# Patient Record
Sex: Female | Born: 1995 | Race: White | Hispanic: No | Marital: Single | Smoking: Never smoker
Health system: Southern US, Community
[De-identification: ages and names within clinical notes are randomized; demographics above are authoritative.]

---

## 2017-11-09 ENCOUNTER — Encounter (HOSPITAL_COMMUNITY): Payer: Self-pay

## 2017-11-09 DIAGNOSIS — N390 Urinary tract infection, site not specified: Secondary | ICD-10-CM | POA: Insufficient documentation

## 2017-11-09 DIAGNOSIS — N200 Calculus of kidney: Secondary | ICD-10-CM | POA: Insufficient documentation

## 2017-11-09 DIAGNOSIS — R109 Unspecified abdominal pain: Secondary | ICD-10-CM | POA: Insufficient documentation

## 2017-11-09 NOTE — ED Triage Notes (Addendum)
Pt reports right flank pain for a couple of weeks, seen at [facility] on Thursday and diagnosed with "multiple small kidney stones" and placed on Norco for pain.  Pt arrives here now to see if we can treat the pain and/or "break up the stones"   Pt states she was told not to come back to [facility], but was not given a referral for urologist.

## 2017-11-10 ENCOUNTER — Emergency Department (HOSPITAL_COMMUNITY): Payer: Self-pay

## 2017-11-10 ENCOUNTER — Emergency Department (HOSPITAL_COMMUNITY)
Admission: EM | Admit: 2017-11-10 | Discharge: 2017-11-10 | Disposition: A | Discharge status: Home / Self Care | Payer: Self-pay | Attending: Emergency Medicine | Admitting: Emergency Medicine

## 2017-11-10 ENCOUNTER — Other Ambulatory Visit: Payer: Self-pay

## 2017-11-10 DIAGNOSIS — R109 Unspecified abdominal pain: Secondary | ICD-10-CM

## 2017-11-10 DIAGNOSIS — N390 Urinary tract infection, site not specified: Secondary | ICD-10-CM

## 2017-11-10 DIAGNOSIS — N200 Calculus of kidney: Secondary | ICD-10-CM

## 2017-11-10 LAB — URINALYSIS, ROUTINE W REFLEX MICROSCOPIC
BACTERIA UA: NONE SEEN
Bilirubin Urine: NEGATIVE
Glucose, UA: NEGATIVE mg/dL
Ketones, ur: NEGATIVE mg/dL
Nitrite: NEGATIVE
PH: 5 (ref 5.0–8.0)
Protein, ur: NEGATIVE mg/dL
Specific Gravity, Urine: 1.013 (ref 1.005–1.030)

## 2017-11-10 LAB — CBC WITH DIFFERENTIAL/PLATELET
Basophils Absolute: 0 10*3/uL (ref 0.0–0.1)
Basophils Relative: 0 %
EOS ABS: 0.3 10*3/uL (ref 0.0–0.7)
EOS PCT: 3 %
HCT: 40.2 % (ref 36.0–46.0)
Hemoglobin: 13.6 g/dL (ref 12.0–15.0)
LYMPHS ABS: 3 10*3/uL (ref 0.7–4.0)
Lymphocytes Relative: 31 %
MCH: 28.5 pg (ref 26.0–34.0)
MCHC: 33.8 g/dL (ref 30.0–36.0)
MCV: 84.3 fL (ref 78.0–100.0)
MONOS PCT: 7 %
Monocytes Absolute: 0.7 10*3/uL (ref 0.1–1.0)
Neutro Abs: 5.9 10*3/uL (ref 1.7–7.7)
Neutrophils Relative %: 59 %
Platelets: 214 10*3/uL (ref 150–400)
RBC: 4.77 MIL/uL (ref 3.87–5.11)
RDW: 11.7 % (ref 11.5–15.5)
WBC: 9.9 10*3/uL (ref 4.0–10.5)

## 2017-11-10 LAB — BASIC METABOLIC PANEL
Anion gap: 10 (ref 5–15)
BUN: 13 mg/dL (ref 6–20)
CALCIUM: 9.7 mg/dL (ref 8.9–10.3)
CO2: 24 mmol/L (ref 22–32)
CREATININE: 0.76 mg/dL (ref 0.44–1.00)
Chloride: 108 mmol/L (ref 101–111)
GFR calc Af Amer: >60 mL/min (ref >60)
Glucose, Bld: 95 mg/dL (ref 65–99)
Potassium: 3.8 mmol/L (ref 3.5–5.1)
SODIUM: 142 mmol/L (ref 135–145)

## 2017-11-10 LAB — PREGNANCY, URINE: Preg Test, Ur: NEGATIVE

## 2017-11-10 MED ORDER — IBUPROFEN 400 MG PO TABS
400.0000 mg | ORAL_TABLET | Freq: Once | ORAL | Status: AC
  Administered 2017-11-10: 400 mg via ORAL
  Filled 2017-11-10: qty 1

## 2017-11-10 MED ORDER — IBUPROFEN 400 MG PO TABS: 400.0000 mg | ORAL_TABLET | Freq: Four times a day (QID) | ORAL | 0 refills | Status: DC | PRN

## 2017-11-10 MED ORDER — CEPHALEXIN 500 MG PO CAPS: 500.0000 mg | ORAL_CAPSULE | Freq: Four times a day (QID) | ORAL | 0 refills | Status: DC

## 2017-11-10 NOTE — Discharge Instructions (Signed)
You have kidney stones in the kidney but none of them are working their way down to her bladder.  This is not causing her flank pain.  Take the antibiotics as prescribed for urinary tract infection and follow-up with the urologist.  Return to the ED if you develop worsening pain, fever or vomiting.

## 2017-11-10 NOTE — ED Provider Notes (Signed)
[facility] EMERGENCY DEPARTMENT Provider Note   CSN: [id number] Arrival date & time: 11/09/17  2306     History   Chief Complaint Chief Complaint  Patient presents with  . Flank Pain    HPI Megan Cherry is a 22 y.o. female.  Patient reports several weeks of right-sided flank pain that comes and goes.  This is associated with nausea.  She has some pain with urination and states she had hematuria when she was seen at [facility] last week.  She states an ultrasound was done that showed she had multiple stones in her kidney as well as swelling along her right kidney.  She returns today with worsening pain though she has not taken a Norco for several days because it makes her dizzy and nauseous.  She denies any fever or chills.  No vomiting.  No diarrhea.  No abdominal pain.  No abnormal bleeding or discharge.  She is never had a kidney stone before.  She has not followed up with urology.   The history is provided by the patient.  Flank Pain  Pertinent negatives include no chest pain, no abdominal pain, no headaches and no shortness of breath.    History reviewed. No pertinent past medical history.  There are no active problems to display for this patient.   History reviewed. No pertinent surgical history.  OB History    No data available       Home Medications    Prior to Admission medications   Medication Sig Start Date End Date Taking? Authorizing Provider  HYDROcodone-acetaminophen (NORCO/VICODIN) 5-325 MG tablet Take 1 tablet by mouth every 6 (six) hours as needed for moderate pain.   Yes [provider]    Family History No family history on file.  Social History Social History   Tobacco Use  . Smoking status: Never Smoker  . Smokeless tobacco: Never Used  Substance Use Topics  . Alcohol use: No    Frequency: Never  . Drug use: No     Allergies   Patient has no known allergies.   Review of Systems Review of Systems    Constitutional: Negative for activity change and appetite change.  HENT: Negative for congestion.   Respiratory: Negative for cough, chest tightness and shortness of breath.   Cardiovascular: Negative for chest pain.  Gastrointestinal: Positive for nausea. Negative for abdominal pain and vomiting.  Genitourinary: Positive for dysuria, flank pain and urgency. Negative for hematuria, vaginal bleeding and vaginal discharge.  Musculoskeletal: Negative for arthralgias and myalgias.  Skin: Negative for rash.  Neurological: Negative for dizziness, weakness and headaches.    all other systems are negative except as noted in the HPI and PMH.    Physical Exam Updated Vital Signs BP 114/89 (BP Location: Right Arm)   Pulse 81   Temp 98.8 F (37.1 C) (Oral)   Resp 18   Ht 5\' 4"  (1.626 m)   Wt 56.7 kg (125 lb)   SpO2 100%   BMI 21.46 kg/m   Physical Exam  Constitutional: She is oriented to person, place, and time. She appears well-developed and well-nourished. No distress.  comfortable appearing  HENT:  Head: Normocephalic and atraumatic.  Mouth/Throat: Oropharynx is clear and moist. No oropharyngeal exudate.  Eyes: Conjunctivae and EOM are normal. Pupils are equal, round, and reactive to light.  Neck: Normal range of motion. Neck supple.  No meningismus.  Cardiovascular: Normal rate, regular rhythm, normal heart sounds and intact distal pulses.  No murmur heard.  Pulmonary/Chest: Effort normal and breath sounds normal. No respiratory distress.  Abdominal: Soft. There is no tenderness. There is no rebound and no guarding.  No RUQ pain  Musculoskeletal: Normal range of motion. She exhibits tenderness. She exhibits no edema.  Right paraspinal lumbar tenderness  Neurological: She is alert and oriented to person, place, and time. No cranial nerve deficit. She exhibits normal muscle tone. Coordination normal.  No ataxia on finger to nose bilaterally. No pronator drift. 5/5 strength  throughout. CN 2-12 intact.Equal grip strength. Sensation intact.   Skin: Skin is warm.  Psychiatric: She has a normal mood and affect. Her behavior is normal.  Nursing note and vitals reviewed.    ED Treatments / Results  Labs (all labs ordered are listed, but only abnormal results are displayed) Labs Reviewed  URINALYSIS, ROUTINE W REFLEX MICROSCOPIC - Abnormal; Notable for the following components:      Result Value   Hgb urine dipstick MODERATE (*)    Leukocytes, UA SMALL (*)    Squamous Epithelial / LPF 0-5 (*)    All other components within normal limits  URINE CULTURE  PREGNANCY, URINE  CBC WITH DIFFERENTIAL/PLATELET  BASIC METABOLIC PANEL    EKG  EKG Interpretation None       Radiology Ct Renal Stone Study  Result Date: 11/10/2017 CLINICAL DATA:  Right flank pain for 2 weeks. Patient had [facility] yesterday and was told she had multiple kidney stones. Nausea. EXAM: CT ABDOMEN AND PELVIS WITHOUT CONTRAST TECHNIQUE: Multidetector CT imaging of the abdomen and pelvis was performed following the standard protocol without IV contrast. COMPARISON:  None. FINDINGS: Lower chest: Lung bases are clear. Hepatobiliary: No focal liver abnormality is seen. No gallstones, gallbladder wall thickening, or biliary dilatation. Pancreas: Unremarkable. No pancreatic ductal dilatation or surrounding inflammatory changes. Spleen: Normal in size without focal abnormality. Adrenals/Urinary Tract: No adrenal gland nodules. 3 mm stone in the lower pole left kidney. Additional tiny punctate stones in both kidneys. No hydronephrosis or hydroureter on either side. No ureteral stones or bladder stones. Bladder wall is not thickened. Stomach/Bowel: Stomach is within normal limits. Appendix appears normal. No evidence of bowel wall thickening, distention, or inflammatory changes. Vascular/Lymphatic: No significant vascular findings are present. No enlarged abdominal or pelvic lymph nodes.  Reproductive: Uterus and bilateral adnexa are unremarkable. Other: No abdominal wall hernia or abnormality. No abdominopelvic ascites. Musculoskeletal: No acute or significant osseous findings. IMPRESSION: 1. Bilateral nonobstructing intrarenal stones. Largest is in the lower pole left kidney at 3 mm. Additional punctate stones in both kidneys are too small to measure. 2. No ureteral stone or obstruction. 3. No evidence of bowel obstruction or inflammation. Electronically Signed   By: Burman NievesWilliam  Stevens M.D.   On: 11/10/2017 02:53    Procedures Procedures (including critical care time)  Medications Ordered in ED Medications  ibuprofen (ADVIL,MOTRIN) tablet 400 mg (not administered)     Initial Impression / Assessment and Plan / ED Course  I have reviewed the triage vital signs and the nursing notes.  Pertinent labs & imaging results that were available during my care of the patient were reviewed by me and considered in my medical decision making (see chart for details).    Right flank pain times several weeks diagnosed with multiple kidney stones.  No fever or vomiting.  Appears well.  Urinalysis has blood in it with white blood cells and bacteria.  We will send culture. HCG negative.  CT scan does not show any hydronephrosis or  hydroureter.  Multiple small stones in the bilateral kidneys.  Discussed with patient these are likely not causing her pain.  Will treat possible UTI. Culture sent. May have passed stone.  Follow up with urology. Return precautions discussed including worsening pain or fever. Final Clinical Impressions(s) / ED Diagnoses   Final diagnoses:  Right flank pain  Nephrolithiasis  Urinary tract infection without hematuria, site unspecified    ED Discharge Orders        Ordered    cephALEXin (KEFLEX) 500 MG capsule  4 times daily     11/10/17 0306    ibuprofen (ADVIL,MOTRIN) 400 MG tablet  Every 6 hours PRN     11/10/17 0306       Glynn Octave,  MD 11/10/17 [phone]

## 2017-11-12 LAB — URINE CULTURE: Culture: >=100000 — AB

## 2017-11-13 ENCOUNTER — Telehealth: Payer: Self-pay | Admitting: Emergency Medicine

## 2017-11-13 NOTE — Telephone Encounter (Signed)
Post ED Visit - Positive Culture Follow-up: Successful Patient Follow-Up  Culture assessed and recommendations reviewed by: []  Enzo BiNathan Batchelder, Pharm.D. []  Celedonio MiyamotoJeremy Frens, Pharm.D., BCPS AQ-ID [x]  Garvin FilaMike Maccia, Pharm.D., BCPS []  Georgina PillionElizabeth Martin, Pharm.D., BCPS []  KenwoodMinh Pham, [address]Pharm.D., BCPS, AAHIVP []  Estella HuskMichelle Turner, Pharm.D., BCPS, AAHIVP []  Lysle Pearlachel Rumbarger, PharmD, BCPS []  Casilda Carlsaylor Stone, PharmD, BCPS []  Pollyann SamplesAndy Johnston, PharmD, BCPS  Positive urine culture  []  Patient discharged without antimicrobial prescription and treatment is now indicated [x]  Organism is resistant to prescribed ED discharge antimicrobial []  Patient with positive blood cultures  Changes discussed with ED provider: Mathews RobinsonsJessica Mitchell PA New antibiotic prescription d/c keflex, start Bactrim DS 1 bid po x 7 days  Attempting to contact patient    Megan MullMiller, Megan Cherry 11/13/2017, 10:02 AM

## 2017-11-13 NOTE — Progress Notes (Signed)
ED Antimicrobial Stewardship Positive Culture Follow Up   Megan Cherry is an 22 y.o. female who presented to [facility] on 11/10/2017 with a chief complaint of  Chief Complaint  Patient presents with  . Flank Pain    Recent Results (from the past 720 hour(s))  Urine Culture     Status: Abnormal   Collection Time: 11/10/17  1:25 AM  Result Value Ref Range Status   Specimen Description   Final    URINE, CLEAN CATCH Performed at [facility], [address] [place], [place] [address]    Special Requests   Final    NONE Performed at [facility], [address] [place], [place] [address]    Culture >=100,000 COLONIES/mL STAPHYLOCOCCUS SAPROPHYTICUS (A)  Final   Report Status 11/12/2017 FINAL  Final   Organism ID, Bacteria STAPHYLOCOCCUS SAPROPHYTICUS (A)  Final      Susceptibility   Staphylococcus saprophyticus - MIC*    CIPROFLOXACIN <=0.5 SENSITIVE Sensitive     GENTAMICIN <=0.5 SENSITIVE Sensitive     NITROFURANTOIN <=16 SENSITIVE Sensitive     OXACILLIN 2 RESISTANT Resistant     TETRACYCLINE <=1 SENSITIVE Sensitive     VANCOMYCIN <=0.5 SENSITIVE Sensitive     TRIMETH/SULFA <=10 SENSITIVE Sensitive     CLINDAMYCIN RESISTANT Resistant     RIFAMPIN <=0.5 SENSITIVE Sensitive     Inducible Clindamycin POSITIVE Resistant     * >=100,000 COLONIES/mL STAPHYLOCOCCUS SAPROPHYTICUS    [x]  Treated with cephalex, organism resistant to prescribed antimicrobial []  Patient discharged originally without antimicrobial agent and treatment is now indicated  New antibiotic prescription: bactrim ds 1 tab bid x 1 week  ED Provider:Jessica Darleene CleaverMitchell, PA-C  [name] 11/13/2017, 9:20 AM Infectious Diseases Pharmacist Phone# [phone]

## 2018-08-12 ENCOUNTER — Telehealth: Payer: Self-pay | Admitting: Emergency Medicine

## 2018-08-12 NOTE — Telephone Encounter (Signed)
Lost to followup 

## 2019-01-06 IMAGING — CT CT RENAL STONE PROTOCOL
2 of 4 series · 16 of 46 positions shown, 18 images · non-contrast
Comparison: None.

CLINICAL DATA: Right flank pain for 2 weeks. Patient had ultrasound
[HOSPITAL] [facility] yesterday and was told she had multiple kidney
stones. Nausea.

EXAM:
CT ABDOMEN AND PELVIS WITHOUT CONTRAST
TECHNIQUE: Multidetector CT imaging of the abdomen and pelvis was performed
following the standard protocol without IV contrast.

[Series 2: axial st · axial · 0.61mm/px · z∈[+1096,+1501]mm · 13 of 89 slices shown, 15 images]
[im 4/89  soft-tissue]
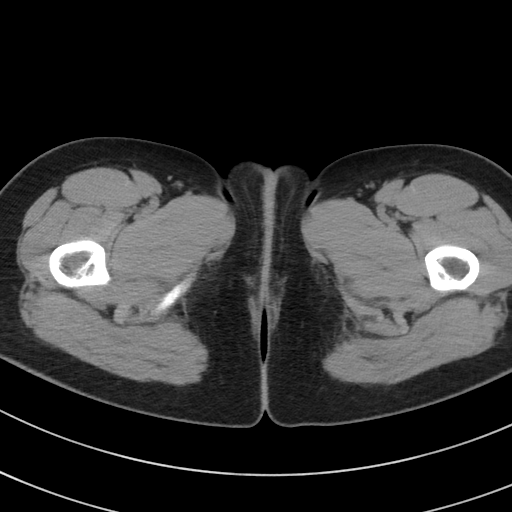
[im 4/89  bone]
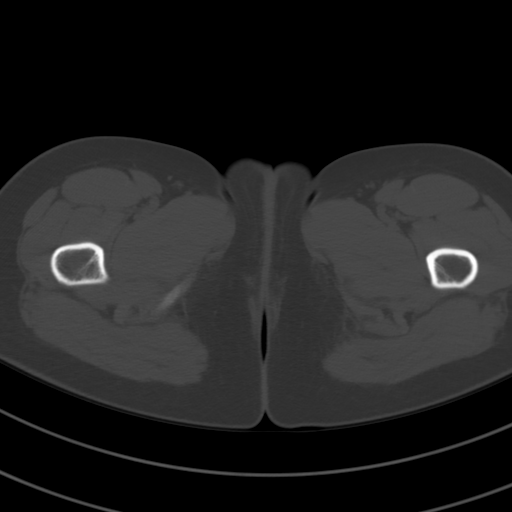
[im 11/89  soft-tissue]
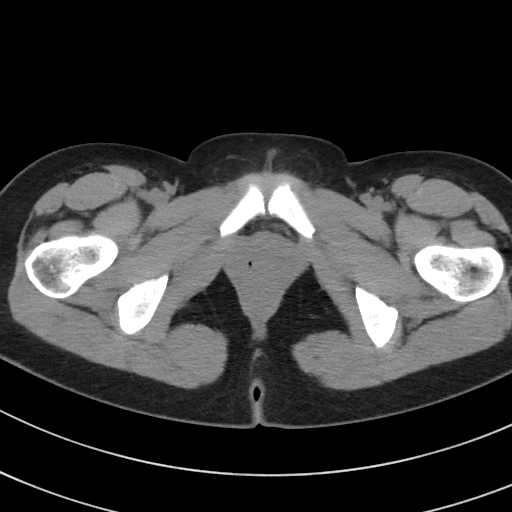
[im 18/89  soft-tissue]
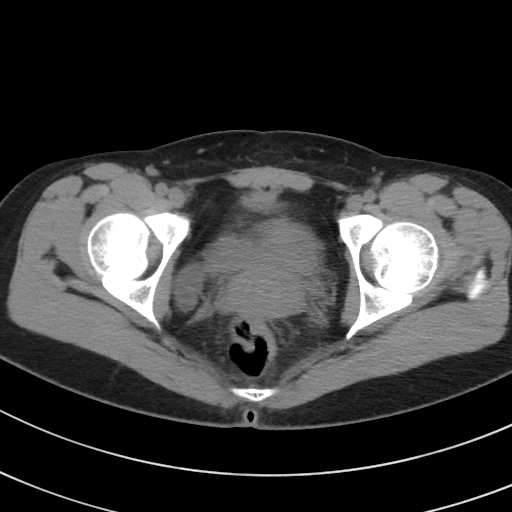
[im 25/89  soft-tissue]
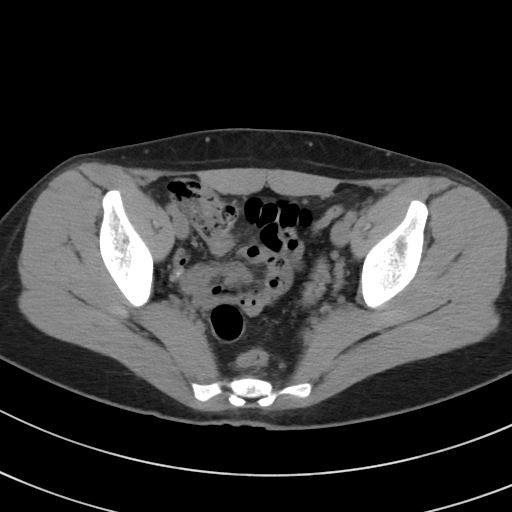
[im 32/89  soft-tissue]
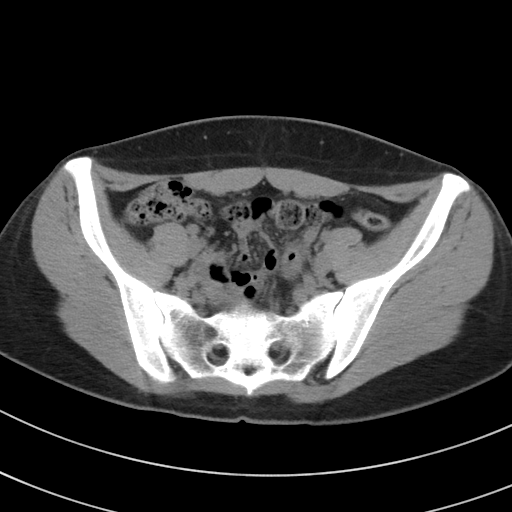
[im 39/89  soft-tissue]
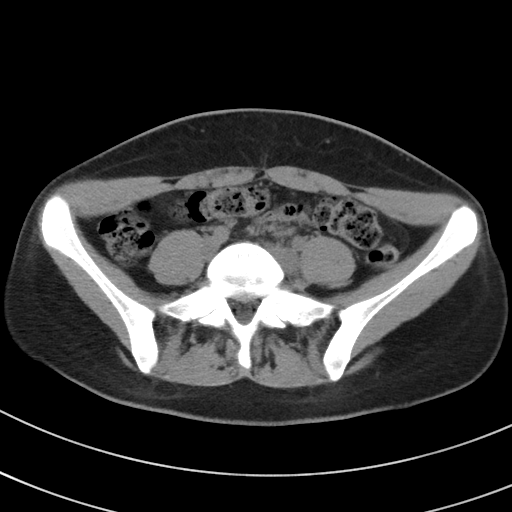
[im 46/89  soft-tissue]
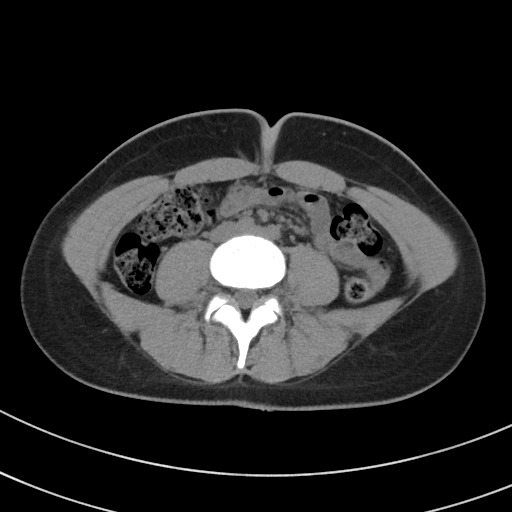
[im 50/89  soft-tissue]
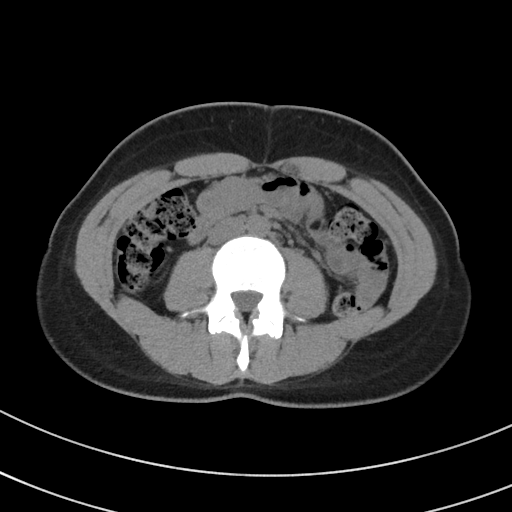
[im 57/89  soft-tissue]
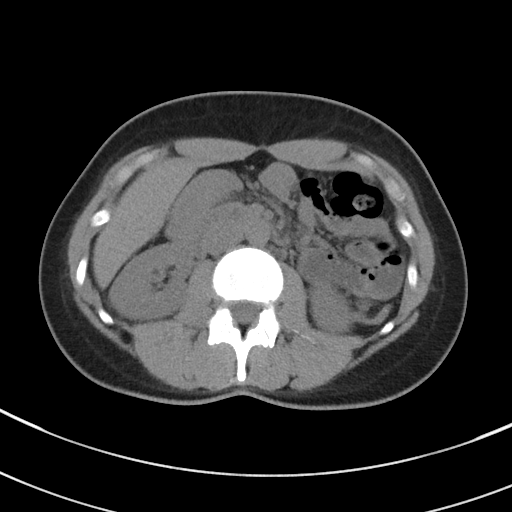
[im 57/89  bone]
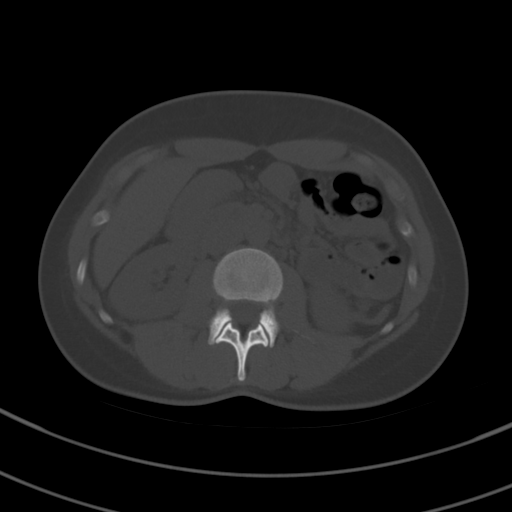
[im 64/89  soft-tissue]
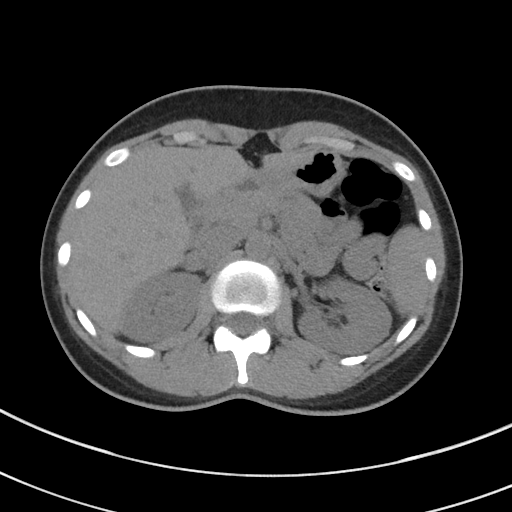
[im 71/89  soft-tissue]
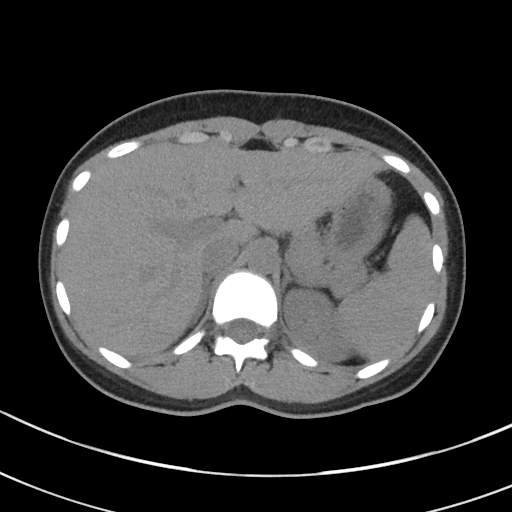
[im 78/89  soft-tissue]
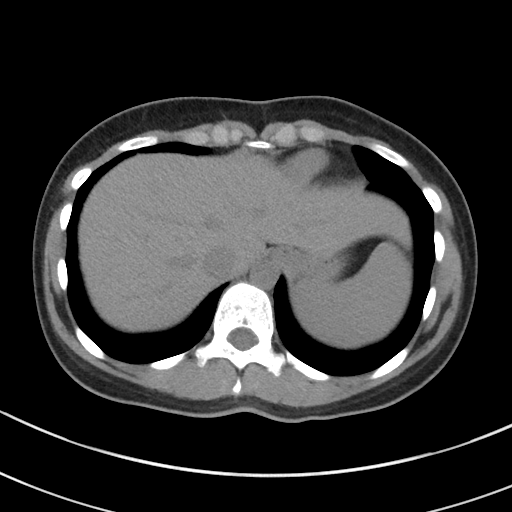
[im 85/89  soft-tissue]
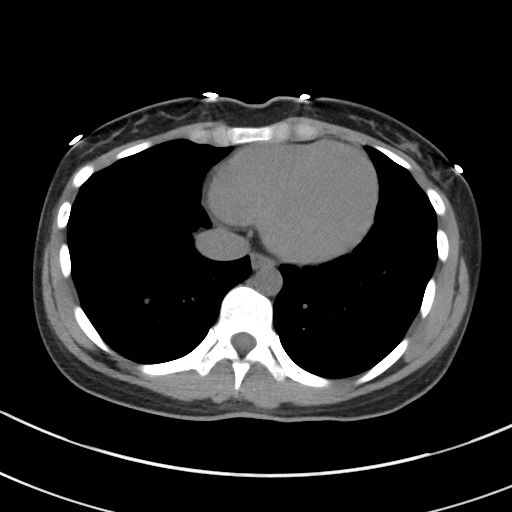

[Series 5: coronal st · coronal · 0.65mm/px · 3 of 80 slices shown]
[im 27/80  soft-tissue]
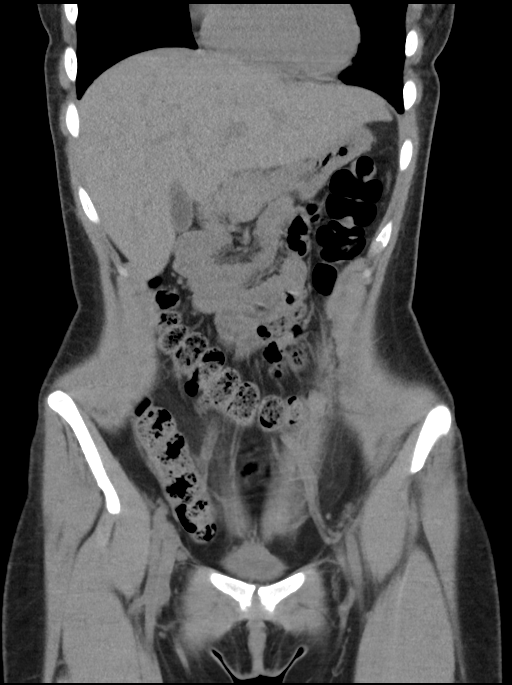
[im 36/80  soft-tissue]
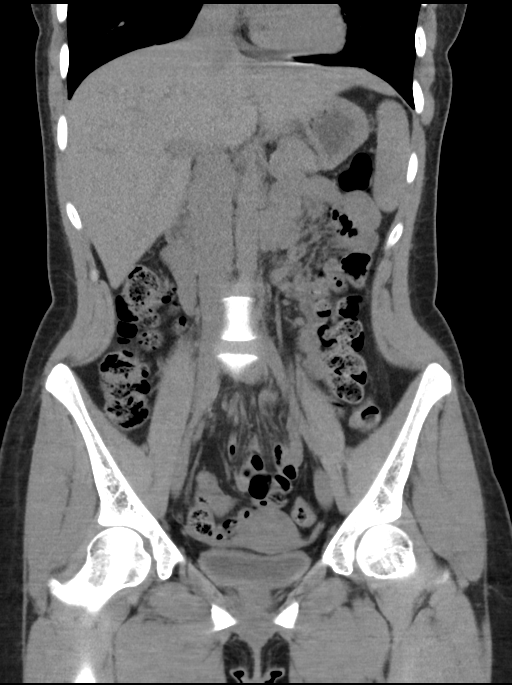
[im 44/80  soft-tissue]
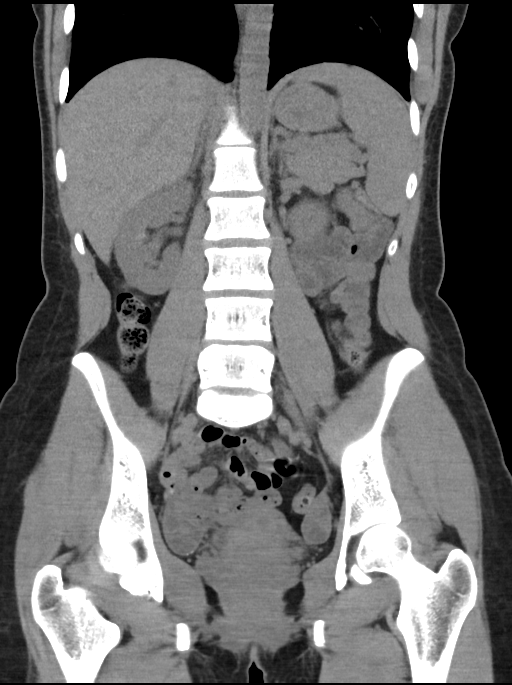

[16 of 46 positions shown; findings below may reference images not displayed]

FINDINGS: Lower chest: Lung bases are clear.

Hepatobiliary: No focal liver abnormality is seen. No gallstones,
gallbladder wall thickening, or biliary dilatation.

Pancreas: Unremarkable. No pancreatic ductal dilatation or
surrounding inflammatory changes.

Spleen: Normal in size without focal abnormality.

Adrenals/Urinary Tract: No adrenal gland nodules. 3 mm stone in the
lower pole left kidney. Additional tiny punctate stones in both
kidneys. No hydronephrosis or hydroureter on either side. No
ureteral stones or bladder stones. Bladder wall is not thickened.

Stomach/Bowel: Stomach is within normal limits. Appendix appears
normal. No evidence of bowel wall thickening, distention, or
inflammatory changes.

Vascular/Lymphatic: No significant vascular findings are present. No
enlarged abdominal or pelvic lymph nodes.

Reproductive: Uterus and bilateral adnexa are unremarkable.

Other: No abdominal wall hernia or abnormality. No abdominopelvic
ascites.

Musculoskeletal: No acute or significant osseous findings.
IMPRESSION: 1. Bilateral nonobstructing intrarenal stones. Largest is in the
lower pole left kidney at 3 mm. Additional punctate stones in both
kidneys are too small to measure.
2. No ureteral stone or obstruction.
3. No evidence of bowel obstruction or inflammation.

## 2022-10-09 HISTORY — PX: COLONOSCOPY: SHX174

## 2024-01-16 ENCOUNTER — Ambulatory Visit (INDEPENDENT_AMBULATORY_CARE_PROVIDER_SITE_OTHER)

## 2024-01-16 ENCOUNTER — Ambulatory Visit: Payer: Self-pay | Admitting: Family Medicine

## 2024-01-16 ENCOUNTER — Encounter: Payer: Self-pay | Admitting: Family Medicine

## 2024-01-16 VITALS — BP 105/65 | HR 65 | Temp 97.6°F | Ht 64.0 in | Wt 134.0 lb

## 2024-01-16 DIAGNOSIS — Z13228 Encounter for screening for other metabolic disorders: Secondary | ICD-10-CM

## 2024-01-16 DIAGNOSIS — G8929 Other chronic pain: Secondary | ICD-10-CM

## 2024-01-16 DIAGNOSIS — M25641 Stiffness of right hand, not elsewhere classified: Secondary | ICD-10-CM

## 2024-01-16 DIAGNOSIS — Z114 Encounter for screening for human immunodeficiency virus [HIV]: Secondary | ICD-10-CM | POA: Diagnosis not present

## 2024-01-16 DIAGNOSIS — M546 Pain in thoracic spine: Secondary | ICD-10-CM | POA: Diagnosis not present

## 2024-01-16 DIAGNOSIS — Z23 Encounter for immunization: Secondary | ICD-10-CM | POA: Diagnosis not present

## 2024-01-16 DIAGNOSIS — Z0001 Encounter for general adult medical examination with abnormal findings: Secondary | ICD-10-CM

## 2024-01-16 DIAGNOSIS — Z130 Encounter for screening for diseases of the blood and blood-forming organs and certain disorders involving the immune mechanism: Secondary | ICD-10-CM | POA: Diagnosis not present

## 2024-01-16 DIAGNOSIS — Z1159 Encounter for screening for other viral diseases: Secondary | ICD-10-CM

## 2024-01-16 DIAGNOSIS — M549 Dorsalgia, unspecified: Secondary | ICD-10-CM | POA: Diagnosis not present

## 2024-01-16 DIAGNOSIS — M79644 Pain in right finger(s): Secondary | ICD-10-CM | POA: Diagnosis not present

## 2024-01-16 DIAGNOSIS — Z13220 Encounter for screening for lipoid disorders: Secondary | ICD-10-CM | POA: Diagnosis not present

## 2024-01-16 DIAGNOSIS — Z1329 Encounter for screening for other suspected endocrine disorder: Secondary | ICD-10-CM | POA: Diagnosis not present

## 2024-01-16 DIAGNOSIS — Z0000 Encounter for general adult medical examination without abnormal findings: Secondary | ICD-10-CM

## 2024-01-16 LAB — LIPID PANEL

## 2024-01-16 NOTE — Progress Notes (Signed)
 Complete physical exam  Patient: Megan Cherry   DOB: 1996/07/24   28 y.o. Female  MRN: [id number]  Subjective:    Chief Complaint  Patient presents with   Hand Pain   Back Pain   Annual Exam    Megan Cherry is a 28 y.o. female who presents today for a complete physical exam. She reports consuming a  well balanced  diet.  Does piliates once a week.  She generally feels well. She reports sleeping poorly. She does have additional problems to discuss today.   Injured her right ring finger about 1 month ago. Not sure exactly what happened, but she was working with a horse and the horse bucked when the injury occurred.   Initially very swollen and bruised. Now only has pain if trying to bend finger or palpating PIP joint. Limited ROM now at PIP and isn't able to fully bend the finger. No numbness or tingling.   Also has had pain along her thoracic spine for 1 year. Pain only occurs with palpation to a particular area. Causes sharp pain that takes her breath at times. No injury. Pain doesn't radiate. Denies numbness, tingling, changes in bowel or bladder control.   Denies hx of anxiety and depression. Really feels like it's mainly a struggle with concentration and focus. Reports has been more busy lately.    Most recent fall risk assessment:    01/16/2024    8:38 AM  Fall Risk   Falls in the past year? 0     Most recent depression screenings:    01/16/2024    8:38 AM  Depression screen PHQ 2/9  Decreased Interest 2  Down, Depressed, Hopeless 0  PHQ - 2 Score 2  Altered sleeping 2  Tired, decreased energy 2  Change in appetite 1  Feeling bad or failure about yourself  0  Trouble concentrating 3  Moving slowly or fidgety/restless 2  Suicidal thoughts 0  PHQ-9 Score 12  Difficult doing work/chores Somewhat difficult      01/16/2024    8:39 AM  GAD 7 : Generalized Anxiety Score  Nervous, Anxious, on Edge 1  Control/stop worrying 1  Worry too much - different things  1  Trouble relaxing 2  Restless 0  Easily annoyed or irritable 0  Afraid - awful might happen 0  Total GAD 7 Score 5  Anxiety Difficulty Not difficult at all      Vision:Not within last year  and Dental: No current dental problems and Receives regular dental care  History reviewed. No pertinent past medical history.    Patient Care Team: Patient, No Pcp Per as PCP - General (General Practice)   Outpatient Medications Prior to Visit  Medication Sig   [DISCONTINUED] cephALEXin (KEFLEX) 500 MG capsule Take 1 capsule (500 mg total) by mouth 4 (four) times daily.   [DISCONTINUED] HYDROcodone-acetaminophen (NORCO/VICODIN) 5-325 MG tablet Take 1 tablet by mouth every 6 (six) hours as needed for moderate pain.   [DISCONTINUED] ibuprofen (ADVIL,MOTRIN) 400 MG tablet Take 1 tablet (400 mg total) by mouth every 6 (six) hours as needed.   No facility-administered medications prior to visit.    Review of Systems  Constitutional:  Negative for chills, fever, malaise/fatigue and weight loss.  Eyes:  Negative for blurred vision, double vision and photophobia.  Respiratory:  Negative for shortness of breath and wheezing.   Cardiovascular:  Negative for chest pain, palpitations and leg swelling.  Gastrointestinal:  Negative for blood in  stool.  Genitourinary:  Negative for dysuria, frequency, hematuria and urgency.  Musculoskeletal:  Positive for back pain.  Neurological:  Positive for headaches. Negative for dizziness, tingling, tremors, sensory change, speech change, focal weakness, seizures, loss of consciousness and weakness.  Psychiatric/Behavioral:  Negative for depression, Saki loss and suicidal ideas. The patient does not have insomnia.         Objective:     BP 105/65   Pulse 65   Temp 97.6 F (36.4 C) (Temporal)   Ht 5\' 4"  (1.626 m)   Wt 134 lb (60.8 kg)   LMP 01/09/2024 (Approximate)   SpO2 99%   BMI 23.00 kg/m    Physical Exam Vitals and nursing note reviewed.   Constitutional:      General: She is not in acute distress.    Appearance: Normal appearance. She is not ill-appearing, toxic-appearing or diaphoretic.  HENT:     Head: Normocephalic.     Right Ear: Tympanic membrane, ear canal and external ear normal.     Left Ear: Tympanic membrane, ear canal and external ear normal.     Nose: Nose normal.     Mouth/Throat:     Mouth: Mucous membranes are moist.     Pharynx: Oropharynx is clear.  Eyes:     Extraocular Movements: Extraocular movements intact.     Conjunctiva/sclera: Conjunctivae normal.     Pupils: Pupils are equal, round, and reactive to light.  Neck:     Thyroid: No thyroid mass, thyromegaly or thyroid tenderness.  Cardiovascular:     Rate and Rhythm: Normal rate and regular rhythm.     Pulses: Normal pulses.     Heart sounds: Normal heart sounds. No murmur heard.    No friction rub. No gallop.  Pulmonary:     Effort: Pulmonary effort is normal.     Breath sounds: Normal breath sounds.  Abdominal:     General: Bowel sounds are normal. There is no distension.     Palpations: Abdomen is soft. There is no mass.     Tenderness: There is no abdominal tenderness. There is no guarding.  Musculoskeletal:     Right hand: Bony tenderness (right ring finger PIP) present. No swelling. Decreased range of motion (right ring finger PIP- decreased flexion). Normal strength. Normal sensation. There is no disruption of two-point discrimination. Normal capillary refill.     Cervical back: Normal range of motion and neck supple. No tenderness.     Thoracic back: Bony tenderness present. No swelling, edema, deformity or signs of trauma. Normal range of motion.     Right lower leg: No edema.     Left lower leg: No edema.  Skin:    General: Skin is warm and dry.     Capillary Refill: Capillary refill takes less than 2 seconds.     Findings: No lesion or rash.  Neurological:     General: No focal deficit present.     Mental Status: She is alert  and oriented to person, place, and time.     Cranial Nerves: No cranial nerve deficit.     Motor: No weakness.     Gait: Gait normal.  Psychiatric:        Mood and Affect: Mood normal.        Behavior: Behavior normal.        Thought Content: Thought content normal.        Judgment: Judgment normal.      No results found for any visits  on 01/16/24.     Assessment & Plan:    Routine Health Maintenance and Physical Exam  [place] was seen today for hand pain, back pain and annual exam.  Diagnoses and all orders for this visit:  Routine general medical examination at a health care facility  Decreased range of motion of finger of right hand PIP joint. Xray pending. Will notify of results when available.  -     DG Finger Ring Right; Future  Chronic midline thoracic back pain Xray pending. Will notify of results when available.  -     DG Thoracic Spine 2 View; Future  Screening for endocrine, metabolic and immunity disorder Fasting labs pending.  -     CBC with Differential/Platelet -     CMP14+EGFR -     TSH  Encounter for screening for lipid disorder -     Lipid panel  Need for hepatitis C screening test -     Hepatitis C antibody  Encounter for screening for HIV -     HIV Antibody (routine testing w rflx)  Need for vaccination -     Tdap vaccine greater than or equal to 7yo IM    Immunization History  Administered Date(s) Administered   DTaP 10/27/1998, 12/16/1998, 11/10/2003, 05/27/2004   HIB, Unspecified 10/27/1998, 12/16/1998   Hepatitis B, PED/ADOLESCENT 10/27/1998, 12/16/1998, 11/10/2003   IPV 10/27/1998, 12/16/1998, 11/10/2003, 05/27/2004   MMR 11/10/2003, 05/27/2004   Tdap 03/15/2010   Varicella 11/10/2003    Health Maintenance  Topic Date Due   HIV Screening  Never done   Hepatitis C Screening  Never done   Cervical Cancer Screening (Pap smear)  Never done   DTaP/Tdap/Td (6 - Td or Tdap) 03/15/2020   HPV VACCINES (1 - 3-dose series) 01/15/2025  (Originally 08/02/2013)   COVID-19 Vaccine (1 - 2024-25 season) 01/31/2025 (Originally 06/10/2023)   INFLUENZA VACCINE  05/09/2024    Discussed health benefits of physical activity, and encouraged her to engage in regular exercise appropriate for her age and condition.  Problem List Items Addressed This Visit   None Visit Diagnoses       Routine general medical examination at a health care facility    -  Primary     Decreased range of motion of finger of right hand       Relevant Orders   DG Finger Ring Right     Chronic midline thoracic back pain       Relevant Orders   DG Thoracic Spine 2 View     Screening for endocrine, metabolic and immunity disorder       Relevant Orders   CBC with Differential/Platelet   CMP14+EGFR   TSH     Encounter for screening for lipid disorder       Relevant Orders   Lipid panel     Need for hepatitis C screening test       Relevant Orders   Hepatitis C antibody     Encounter for screening for HIV       Relevant Orders   HIV Antibody (routine testing w rflx)     Need for vaccination       Relevant Orders   Tdap vaccine greater than or equal to 7yo IM (Completed)      Return in about 4 months (around 05/17/2024) for PAP.   The patient indicates understanding of these issues and agrees with the plan.  Gabriel Earing, FNP

## 2024-01-16 NOTE — Patient Instructions (Signed)

## 2024-01-17 ENCOUNTER — Encounter: Payer: Self-pay | Admitting: Family Medicine

## 2024-01-17 LAB — CMP14+EGFR
ALT: 12 IU/L (ref 0–32)
AST: 18 IU/L (ref 0–40)
Albumin: 4.5 g/dL (ref 4.0–5.0)
Alkaline Phosphatase: 59 IU/L (ref 44–121)
BUN/Creatinine Ratio: 26 — ABNORMAL HIGH (ref 9–23)
BUN: 21 mg/dL — ABNORMAL HIGH (ref 6–20)
Bilirubin Total: 0.5 mg/dL (ref 0.0–1.2)
CO2: 23 mmol/L (ref 20–29)
Calcium: 9.5 mg/dL (ref 8.7–10.2)
Chloride: 103 mmol/L (ref 96–106)
Creatinine, Ser: 0.82 mg/dL (ref 0.57–1.00)
Globulin, Total: 2.1 g/dL (ref 1.5–4.5)
Glucose: 80 mg/dL (ref 70–99)
Potassium: 4.3 mmol/L (ref 3.5–5.2)
Sodium: 139 mmol/L (ref 134–144)
Total Protein: 6.6 g/dL (ref 6.0–8.5)
eGFR: 102 mL/min/{1.73_m2} (ref >59)

## 2024-01-17 LAB — CBC WITH DIFFERENTIAL/PLATELET
Basophils Absolute: 0.1 10*3/uL (ref 0.0–0.2)
Basos: 1 %
EOS (ABSOLUTE): 0.2 10*3/uL (ref 0.0–0.4)
Eos: 3 %
Hematocrit: 40.7 % (ref 34.0–46.6)
Hemoglobin: 13.4 g/dL (ref 11.1–15.9)
Immature Grans (Abs): 0 10*3/uL (ref 0.0–0.1)
Immature Granulocytes: 0 %
Lymphocytes Absolute: 2.2 10*3/uL (ref 0.7–3.1)
Lymphs: 28 %
MCH: 29.5 pg (ref 26.6–33.0)
MCHC: 32.9 g/dL (ref 31.5–35.7)
MCV: 90 fL (ref 79–97)
Monocytes Absolute: 0.4 10*3/uL (ref 0.1–0.9)
Monocytes: 5 %
Neutrophils Absolute: 4.9 10*3/uL (ref 1.4–7.0)
Neutrophils: 63 %
Platelets: 210 10*3/uL (ref 150–450)
RBC: 4.55 x10E6/uL (ref 3.77–5.28)
RDW: 12.8 % (ref 11.7–15.4)
WBC: 7.8 10*3/uL (ref 3.4–10.8)

## 2024-01-17 LAB — TSH: TSH: 2.72 u[IU]/mL (ref 0.450–4.500)

## 2024-01-17 LAB — LIPID PANEL
Cholesterol, Total: 157 mg/dL (ref 100–199)
HDL: 47 mg/dL (ref >39)
LDL CALC COMMENT:: 3.3 ratio (ref 0.0–4.4)
LDL Chol Calc (NIH): 96 mg/dL (ref 0–99)
Triglycerides: 73 mg/dL (ref 0–149)
VLDL Cholesterol Cal: 14 mg/dL (ref 5–40)

## 2024-01-17 LAB — HIV ANTIBODY (ROUTINE TESTING W REFLEX)

## 2024-01-17 LAB — HEPATITIS C ANTIBODY

## 2024-01-19 DIAGNOSIS — Z419 Encounter for procedure for purposes other than remedying health state, unspecified: Secondary | ICD-10-CM | POA: Diagnosis not present

## 2024-01-23 ENCOUNTER — Encounter: Payer: Self-pay | Admitting: Family Medicine

## 2024-01-23 ENCOUNTER — Other Ambulatory Visit: Payer: Self-pay | Admitting: Family Medicine

## 2024-01-23 DIAGNOSIS — M25641 Stiffness of right hand, not elsewhere classified: Secondary | ICD-10-CM

## 2024-02-12 ENCOUNTER — Ambulatory Visit: Admitting: Physician Assistant

## 2024-02-14 ENCOUNTER — Ambulatory Visit: Payer: Self-pay | Admitting: Physician Assistant

## 2024-02-18 DIAGNOSIS — Z419 Encounter for procedure for purposes other than remedying health state, unspecified: Secondary | ICD-10-CM | POA: Diagnosis not present

## 2024-02-19 ENCOUNTER — Encounter: Payer: Self-pay | Admitting: Physician Assistant

## 2024-02-19 ENCOUNTER — Ambulatory Visit (INDEPENDENT_AMBULATORY_CARE_PROVIDER_SITE_OTHER): Admitting: Physician Assistant

## 2024-02-19 DIAGNOSIS — M79644 Pain in right finger(s): Secondary | ICD-10-CM

## 2024-02-19 DIAGNOSIS — M546 Pain in thoracic spine: Secondary | ICD-10-CM | POA: Diagnosis not present

## 2024-02-19 NOTE — Progress Notes (Signed)
 Office Visit Note   Patient: Megan Cherry           Date of Birth: [birth year]           MRN: [id number] Visit Date: 02/19/2024              Requested by: Albertha Huger, FNP [address] [place],  [place] [address] PCP: Albertha Huger, FNP   Assessment & Plan: Visit Diagnoses:  1. Pain in right finger(s)   2. Pain in thoracic spine     Plan: Patient is a pleasant 28 year old woman who comes in for 2 reasons today.  First she has a 2-1/8-month history of decreased range of motion and pain in her right ring finger.  She is left-hand dominant.  She said this first started when she was holding onto a lead line when she was placing her horse in a pasture.  The course reared up.  She is not quite sure what happened but after that she had significant ecchymosis pain and swelling which she thinks started at the PIP joint of the right ring finger.  Over the last 2 months she has had some improvement but still has focal pain and loss of motion making her grip strength decreased.  She did have x-rays which did not demonstrate any bony abnormality.  Since this has been going on for couple months I suggested trying a couple sessions with occupational therapy she is willing to do this. With regards to her other issues she has been having focal thoracic spine pain for about a year.  She denies any injury.  She does say she has a family history of back issues and as she is engaged in preparing to get married she does not want this to affect her as she is married and anticipates having a family.  She said it does not give her much radicular symptoms but is focal focally tender in 1 area.  Exam today she has 1 area of focal tenderness no radicular findings been going on over a year without any type of particular injury.  I have told her we could get an MRI she could follow-up with one of our spine  Follow-Up Instructions: No follow-ups on file.   Orders:  Orders Placed This Encounter  Procedures    MR Thoracic Spine w/o contrast   Ambulatory referral to Occupational Therapy   No orders of the defined types were placed in this encounter.     Procedures: No procedures performed   Clinical Data: No additional findings.   Subjective: Chief Complaint  Patient presents with   Right Ring Finger - Injury    Patient is a pleasant 28 year old woman who comes in today with a 2-1/67-month history of right ring finger pain after her horse reared up while she was holding a lead line.  She had significant ecchymosis at the time now has some pain specially at the PIP joint and decreased range of motion also comes in today complaining of thoracic back pain focal in nature over her mid thoracic spine denies any injuries denies any paresthesias but has not gone away in the last year and gotten better.  No fever chills  Review of Systems  All other systems reviewed and are negative.    Objective: Vital Signs: There were no vitals taken for this visit.  Physical Exam Constitutional:      Appearance: Normal appearance.  Pulmonary:     Effort: Pulmonary effort is normal.  Skin:  General: Skin is warm and dry.  Neurological:     General: No focal deficit present.     Mental Status: She is alert and oriented to person, place, and time.  Psychiatric:        Mood and Affect: Mood normal.     Ortho Exam Examination of her right hand she has mild soft tissue swelling no ecchymosis ring finger decreased flexion at the MCP joint.  She does have some tenderness over the PIP joint.  More on the ulnar side than the radial side.  Neurovascular intact. Thoracic spine no evidence of any radicular findings no step-offs no fluctuance she has point tenderness over the thoracic spine in the mid spine. Specialty Comments:  No specialty comments available.  Imaging: No results found.   PMFS History: There are no active problems to display for this patient.  History reviewed. No pertinent past  medical history.  Family History  Problem Relation Age of Onset   Stroke Mother    Depression Mother    Spina bifida Mother    Drug abuse Father     Past Surgical History:  Procedure Laterality Date   COLONOSCOPY  2024   Social History   Occupational History   Not on file  Tobacco Use   Smoking status: Never   Smokeless tobacco: Never  Vaping Use   Vaping status: Never Used  Substance and Sexual Activity   Alcohol use: Yes    Alcohol/week: 2.0 standard drinks of alcohol    Types: 2 Glasses of wine per week   Drug use: No   Sexual activity: Yes    Birth control/protection: None

## 2024-02-25 ENCOUNTER — Other Ambulatory Visit

## 2024-03-13 ENCOUNTER — Ambulatory Visit: Attending: Physician Assistant | Admitting: Occupational Therapy

## 2024-03-13 NOTE — Therapy (Deleted)
 OUTPATIENT OCCUPATIONAL THERAPY ORTHO EVALUATION  Patient Name: ARDICE BOYAN MRN: [id number] DOB:1996/02/12, 28 y.o., female Today's Date: 03/13/2024  PCP: *** REFERRING PROVIDER: ***  END OF SESSION:   No past medical history on file. Past Surgical History:  Procedure Laterality Date   COLONOSCOPY  2024   There are no active problems to display for this patient.   ONSET DATE: ***  REFERRING DIAG: ***  THERAPY DIAG:  No diagnosis found.  Rationale for Evaluation and Treatment: {HABREHAB:27488}  SUBJECTIVE:   SUBJECTIVE STATEMENT: *** Pt accompanied by: {accompnied:27141}  PERTINENT HISTORY: ***  PRECAUTIONS: {Therapy precautions:24002}  RED FLAGS: {PT Red Flags:29287}   WEIGHT BEARING RESTRICTIONS: {Yes ***/No:24003}  PAIN:  Are you having pain? {OPRCPAIN:27236}  FALLS: Has patient fallen in last 6 months? {fallsyesno:27318}  LIVING ENVIRONMENT: Lives with: {OPRC lives with:25569::"lives with their family"} Lives in: {Lives in:25570} Stairs: {opstairs:27293} Has following equipment at home: {Assistive devices:23999}  PLOF: {PLOF:24004}  PATIENT GOALS: ***  NEXT MD VISIT: ***  OBJECTIVE:  Note: Objective measures were completed at Evaluation unless otherwise noted.  HAND DOMINANCE: {MISC; OT HAND [phone]}  ADLs: {ADLs OT:31716}  FUNCTIONAL OUTCOME MEASURES: {OTFUNCTIONALMEASURES:27238}  UPPER EXTREMITY ROM:     {AROM/PROM:27142} ROM Right eval Left eval  Shoulder flexion    Shoulder abduction    Shoulder adduction    Shoulder extension    Shoulder internal rotation    Shoulder external rotation    Elbow flexion    Elbow extension    Wrist flexion    Wrist extension    Wrist ulnar deviation    Wrist radial deviation    Wrist pronation    Wrist supination    (Blank rows = not tested)  {AROM/PROM:27142} ROM Right eval Left eval  Thumb MCP (0-60)    Thumb IP (0-80)    Thumb Radial abd/add (0-55)     Thumb  Palmar abd/add (0-45)     Thumb Opposition to Small Finger     Index MCP (0-90)     Index PIP (0-100)     Index DIP (0-70)      Long MCP (0-90)      Long PIP (0-100)      Long DIP (0-70)      Ring MCP (0-90)      Ring PIP (0-100)      Ring DIP (0-70)      Little MCP (0-90)      Little PIP (0-100)      Little DIP (0-70)      (Blank rows = not tested)   UPPER EXTREMITY MMT:     MMT Right eval Left eval  Shoulder flexion    Shoulder abduction    Shoulder adduction    Shoulder extension    Shoulder internal rotation    Shoulder external rotation    Middle trapezius    Lower trapezius    Elbow flexion    Elbow extension    Wrist flexion    Wrist extension    Wrist ulnar deviation    Wrist radial deviation    Wrist pronation    Wrist supination    (Blank rows = not tested)  HAND FUNCTION: {handfunction:27230}  COORDINATION: {otcoordination:27237}  SENSATION: {sensation:27233}  EDEMA: ***  COGNITION: Overall cognitive status: {cognition:24006} Areas of impairment: {impairedcognition:27234}  OBSERVATIONS: ***   TREATMENT DATE: ***  Modalities: {OPRCMODALITIES:31717}     PATIENT EDUCATION: Education details: *** Person educated: {Person educated:25204} Education method: {Education Method:25205} Education comprehension: {Education Comprehension:25206}  HOME EXERCISE PROGRAM: ***  GOALS: Goals reviewed with patient? {yes/no:20286}  SHORT TERM GOALS: Target date: ***  *** Baseline: Goal status: INITIAL  2.  *** Baseline:  Goal status: INITIAL  3.  *** Baseline:  Goal status: INITIAL  4.  *** Baseline:  Goal status: INITIAL  5.  *** Baseline:  Goal status: INITIAL  6.  *** Baseline:  Goal status: INITIAL  LONG TERM GOALS: Target date: ***  *** Baseline:  Goal status: INITIAL  2.  *** Baseline:  Goal  status: INITIAL  3.  *** Baseline:  Goal status: INITIAL  4.  *** Baseline:  Goal status: INITIAL  5.  *** Baseline:  Goal status: INITIAL  6.  *** Baseline:  Goal status: INITIAL  ASSESSMENT:  CLINICAL IMPRESSION: Patient is a *** y.o. *** who was seen today for occupational therapy evaluation for ***.   PERFORMANCE DEFICITS: in functional skills including {OT physical skills:25468}, cognitive skills including {OT cognitive skills:25469}, and psychosocial skills including {OT psychosocial skills:25470}.   IMPAIRMENTS: are limiting patient from {OT performance deficits:25471}.   COMORBIDITIES: {Comorbidities:25485} that affects occupational performance. Patient will benefit from skilled OT to address above impairments and improve overall function.  MODIFICATION OR ASSISTANCE TO COMPLETE EVALUATION: {OT modification:25474}  OT OCCUPATIONAL PROFILE AND HISTORY: {OT PROFILE AND HISTORY:25484}  CLINICAL DECISION MAKING: {OT CDM:25475}  REHAB POTENTIAL: {rehabpotential:25112}  EVALUATION COMPLEXITY: {Evaluation complexity:25115}      PLAN:  OT FREQUENCY: {rehab frequency:25116}  OT DURATION: {rehab duration:25117}  PLANNED INTERVENTIONS: {OT Interventions:25467}  RECOMMENDED OTHER SERVICES: ***  CONSULTED AND AGREED WITH PLAN OF CARE: {ZOX:09604}  PLAN FOR NEXT SESSION: Anthonette Kinsman, OTR/L 03/13/2024, 10:01 AM   [facility] at [facility]: [address] [place], [place] [address] Phone # [phone] Fax # [phone]

## 2024-03-20 DIAGNOSIS — Z419 Encounter for procedure for purposes other than remedying health state, unspecified: Secondary | ICD-10-CM | POA: Diagnosis not present

## 2024-03-31 ENCOUNTER — Ambulatory Visit: Admitting: Occupational Therapy

## 2024-03-31 NOTE — Therapy (Incomplete)
 OUTPATIENT OCCUPATIONAL THERAPY ORTHO EVALUATION  Patient Name: Megan Cherry MRN: [id number] DOB:[birth year], 28 y.o., female Today's Date: 03/31/2024  PCP: *** REFERRING PROVIDER: ***  END OF SESSION:   No past medical history on file. Past Surgical History:  Procedure Laterality Date   COLONOSCOPY  2024   There are no active problems to display for this patient.   ONSET DATE: ***  REFERRING DIAG: ***  THERAPY DIAG:  No diagnosis found.  Rationale for Evaluation and Treatment: {HABREHAB:27488}  SUBJECTIVE:   SUBJECTIVE STATEMENT: *** Pt accompanied by: {accompnied:27141}  PERTINENT HISTORY: ***  PRECAUTIONS: {Therapy precautions:24002}  RED FLAGS: {PT Red Flags:29287}   WEIGHT BEARING RESTRICTIONS: {Yes ***/No:24003}  PAIN:  Are you having pain? {OPRCPAIN:27236}  FALLS: Has patient fallen in last 6 months? {fallsyesno:27318}  LIVING ENVIRONMENT: Lives with: {OPRC lives with:25569::lives with their family} Lives in: {Lives in:25570} Stairs: {opstairs:27293} Has following equipment at home: {Assistive devices:23999}  PLOF: {PLOF:24004}  PATIENT GOALS: ***  NEXT MD VISIT: ***  OBJECTIVE:  Note: Objective measures were completed at Evaluation unless otherwise noted.  HAND DOMINANCE: {MISC; OT HAND DOMINANCE:[phone]}  ADLs: {ADLs OT:31716}  FUNCTIONAL OUTCOME MEASURES: {OTFUNCTIONALMEASURES:27238}  UPPER EXTREMITY ROM:     {AROM/PROM:27142} ROM Right eval Left eval  Shoulder flexion    Shoulder abduction    Shoulder adduction    Shoulder extension    Shoulder internal rotation    Shoulder external rotation    Elbow flexion    Elbow extension    Wrist flexion    Wrist extension    Wrist ulnar deviation    Wrist radial deviation    Wrist pronation    Wrist supination    (Blank rows = not tested)  {AROM/PROM:27142} ROM Right eval Left eval  Thumb MCP (0-60)    Thumb IP (0-80)    Thumb Radial abd/add (0-55)     Thumb  Palmar abd/add (0-45)     Thumb Opposition to Small Finger     Index MCP (0-90)     Index PIP (0-100)     Index DIP (0-70)      Long MCP (0-90)      Long PIP (0-100)      Long DIP (0-70)      Ring MCP (0-90)      Ring PIP (0-100)      Ring DIP (0-70)      Little MCP (0-90)      Little PIP (0-100)      Little DIP (0-70)      (Blank rows = not tested)   UPPER EXTREMITY MMT:     MMT Right eval Left eval  Shoulder flexion    Shoulder abduction    Shoulder adduction    Shoulder extension    Shoulder internal rotation    Shoulder external rotation    Middle trapezius    Lower trapezius    Elbow flexion    Elbow extension    Wrist flexion    Wrist extension    Wrist ulnar deviation    Wrist radial deviation    Wrist pronation    Wrist supination    (Blank rows = not tested)  HAND FUNCTION: {handfunction:27230}  COORDINATION: {otcoordination:27237}  SENSATION: {sensation:27233}  EDEMA: ***  COGNITION: Overall cognitive status: {cognition:24006} Areas of impairment: {impairedcognition:27234}  OBSERVATIONS: ***   TREATMENT DATE: ***  Modalities: {OPRCMODALITIES:31717}     PATIENT EDUCATION: Education details: *** Person educated: {Person educated:25204} Education method: {Education Method:25205} Education comprehension: {Education Comprehension:25206}  HOME EXERCISE PROGRAM: ***  GOALS: Goals reviewed with patient? {yes/no:20286}  SHORT TERM GOALS: Target date: ***  *** Baseline: Goal status: INITIAL  2.  *** Baseline:  Goal status: INITIAL  3.  *** Baseline:  Goal status: INITIAL  4.  *** Baseline:  Goal status: INITIAL  5.  *** Baseline:  Goal status: INITIAL  6.  *** Baseline:  Goal status: INITIAL  LONG TERM GOALS: Target date: ***  *** Baseline:  Goal status: INITIAL  2.  *** Baseline:  Goal  status: INITIAL  3.  *** Baseline:  Goal status: INITIAL  4.  *** Baseline:  Goal status: INITIAL  5.  *** Baseline:  Goal status: INITIAL  6.  *** Baseline:  Goal status: INITIAL  ASSESSMENT:  CLINICAL IMPRESSION: Patient is a *** y.o. *** who was seen today for occupational therapy evaluation for ***.   PERFORMANCE DEFICITS: in functional skills including {OT physical skills:25468}, cognitive skills including {OT cognitive skills:25469}, and psychosocial skills including {OT psychosocial skills:25470}.   IMPAIRMENTS: are limiting patient from {OT performance deficits:25471}.   COMORBIDITIES: {Comorbidities:25485} that affects occupational performance. Patient will benefit from skilled OT to address above impairments and improve overall function.  MODIFICATION OR ASSISTANCE TO COMPLETE EVALUATION: {OT modification:25474}  OT OCCUPATIONAL PROFILE AND HISTORY: {OT PROFILE AND HISTORY:25484}  CLINICAL DECISION MAKING: {OT CDM:25475}  REHAB POTENTIAL: {rehabpotential:25112}  EVALUATION COMPLEXITY: {Evaluation complexity:25115}      PLAN:  OT FREQUENCY: {rehab frequency:25116}  OT DURATION: {rehab duration:25117}  PLANNED INTERVENTIONS: {OT Interventions:25467}  RECOMMENDED OTHER SERVICES: ***  CONSULTED AND AGREED WITH PLAN OF CARE: {ENR:74513}  PLAN FOR NEXT SESSION: PIERRETTE KAYLENE DOMINO, OTR/L 03/31/2024, 9:30 AM  [facility] at [facility] [address] [place], [place] [address] Phone # [phone] Fax # [phone]

## 2024-04-19 DIAGNOSIS — Z419 Encounter for procedure for purposes other than remedying health state, unspecified: Secondary | ICD-10-CM | POA: Diagnosis not present

## 2024-05-08 ENCOUNTER — Other Ambulatory Visit (HOSPITAL_BASED_OUTPATIENT_CLINIC_OR_DEPARTMENT_OTHER): Payer: Self-pay | Admitting: Physician Assistant

## 2024-05-08 DIAGNOSIS — M546 Pain in thoracic spine: Secondary | ICD-10-CM

## 2024-05-19 ENCOUNTER — Encounter: Admitting: Family Medicine

## 2024-05-19 NOTE — Therapy (Deleted)
 OUTPATIENT PHYSICAL THERAPY THORACOLUMBAR EVALUATION   Patient Name: Megan Cherry MRN: [id number] DOB:[birth year], 28 y.o., female Today's Date: 05/19/2024  END OF SESSION:   No past medical history on file. Past Surgical History:  Procedure Laterality Date   COLONOSCOPY  2024   There are no active problems to display for this patient.   PCP: Joesph Annabella HERO, FNP   REFERRING PROVIDER: Persons, Ronal Dragon, [place]  REFERRING DIAG: M54.6 (ICD-10-CM) - Pain in thoracic spine  Rationale for Evaluation and Treatment: Rehabilitation  THERAPY DIAG:  No diagnosis found.  ONSET DATE: 6 months  SUBJECTIVE:                                                                                                                                                                                           SUBJECTIVE STATEMENT: Patient is a pleasant 28 year old woman who comes in for 2 reasons today.  First she has a 2-1/20-month history of decreased range of motion and pain in her right ring finger.  She is left-hand dominant.  She said this first started when she was holding onto a lead line when she was placing her horse in a pasture.  The course reared up.  She is not quite sure what happened but after that she had significant ecchymosis pain and swelling which she thinks started at the PIP joint of the right ring finger.  Over the last 2 months she has had some improvement but still has focal pain and loss of motion making her grip strength decreased.  She did have x-rays which did not demonstrate any bony abnormality.  Since this has been going on for couple months I suggested trying a couple sessions with occupational therapy she is willing to do this. With regards to her other issues she has been having focal thoracic spine pain for about a year.  She denies any injury.  She does say she has a family history of back issues and as she is engaged in preparing to get married she does not want this to  affect her as she is married and anticipates having a family.  She said it does not give her much radicular symptoms but is focal focally tender in 1 area.  Exam today she has 1 area of focal tenderness no radicular findings been going on over a year without any type of particular injury.  I have told her we could get an MRI she could follow-up with one of our spine  PERTINENT HISTORY:  Patient is a pleasant 28 year old woman who comes in today with a 2-1/31-month history of right ring finger pain after her horse  reared up while she was holding a lead line. She had significant ecchymosis at the time now has some pain specially at the PIP joint and decreased range of motion also comes in today complaining of thoracic back pain focal in nature over her mid thoracic spine denies any injuries denies any paresthesias but has not gone away in the last year and gotten better. No fever chills  PAIN:  Are you having pain? {OPRCPAIN:27236}  PRECAUTIONS: None  RED FLAGS: None   WEIGHT BEARING RESTRICTIONS: No  FALLS:  Has patient fallen in last 6 months? No  OCCUPATION: ***  PLOF: Independent  PATIENT GOALS: ***  NEXT MD VISIT: TBD  OBJECTIVE:  Note: Objective measures were completed at Evaluation unless otherwise noted.  DIAGNOSTIC FINDINGS:  FINDINGS: Pedicles are within normal limits. Vertebral body height is well maintained. No paraspinal mass is seen. No soft tissue changes are noted. Visualize ribcage is within normal limits.   IMPRESSION: No acute abnormality noted.     Electronically Signed   By: Oneil Devonshire M.D.   On: 01/23/2024 01:28  PATIENT SURVEYS:  Modified Oswestry:  MODIFIED OSWESTRY DISABILITY SCALE  Date: *** Score  Pain intensity {ODI 1:32962}  2. Personal care (washing, dressing, etc.) {ODI 2:32963}  3. Lifting {ODI 3:32964}  4. Walking {ODI 4:32965}  5. Sitting {ODI 5:32966}  6. Standing {ODI 6:32967}  7. Sleeping {ODI 7:32968}  8. Social Life {ODI  8:32969}  9. Traveling {ODI 9:32970}  10. Employment/ Homemaking {ODI 10:32971}  Total ***/50   Interpretation of scores: Score Category Description  0-20% Minimal Disability The patient can cope with most living activities. Usually no treatment is indicated apart from advice on lifting, sitting and exercise  21-40% Moderate Disability The patient experiences more pain and difficulty with sitting, lifting and standing. Travel and social life are more difficult and they may be disabled from work. Personal care, sexual activity and sleeping are not grossly affected, and the patient can usually be managed by conservative means  41-60% Severe Disability Pain remains the main problem in this group, but activities of daily living are affected. These patients require a detailed investigation  61-80% Crippled Back pain impinges on all aspects of the patient's life. Positive intervention is required  81-100% Bed-bound  These patients are either bed-bound or exaggerating their symptoms  Bluford FORBES Zoe DELENA Karon DELENA, et al. Surgery versus conservative management of stable thoracolumbar fracture: the PRESTO feasibility RCT. [place] ([place]): [employer]; 2021 Nov. [facility] Technology Assessment, No. 25.62.) Appendix 3, Oswestry Disability Index category descriptors. Available from: FindJewel[employer]cz  Minimally Clinically Important Difference (MCID) = 12.8%   MUSCLE LENGTH: Hamstrings: Right *** deg; Left *** deg Debby test: Right *** deg; Left *** deg  POSTURE: {posture:25561}  PALPATION: ***  LUMBAR ROM:   AROM eval  Flexion   Extension   Right lateral flexion   Left lateral flexion   Right rotation   Left rotation    (Blank rows = not tested)  LOWER EXTREMITY ROM:     {AROM/PROM:27142}  Right eval Left eval  Hip flexion    Hip extension    Hip abduction    Hip adduction    Hip internal rotation    Hip external rotation    Knee flexion     Knee extension    Ankle dorsiflexion    Ankle plantarflexion    Ankle inversion    Ankle eversion     (Blank rows = not tested)  LOWER EXTREMITY  MMT:    MMT Right eval Left eval  Hip flexion    Hip extension    Hip abduction    Hip adduction    Hip internal rotation    Hip external rotation    Knee flexion    Knee extension    Ankle dorsiflexion    Ankle plantarflexion    Ankle inversion    Ankle eversion     (Blank rows = not tested)  LUMBAR SPECIAL TESTS:  Straight leg raise test: {pos/neg:25243} and Slump test: {pos/neg:25243}  FUNCTIONAL TESTS:  5 times sit to stand: *** 30 seconds chair stand test  GAIT: Distance walked: 77ftx2 Assistive device utilized: None Level of assistance: Complete Independence Comments: ***  TREATMENT:                                                                                                                            [facility] Adult PT Treatment:                                                DATE: *** Therapeutic Exercise: *** Manual Therapy: *** Neuromuscular re-ed: *** Therapeutic Activity: *** Modalities: *** Self Care: ***    PATIENT EDUCATION:  Education details: Patient to demonstrate independence in HEP  Person educated: Patient Education method: Chief Technology Officer Education comprehension: verbalized understanding and needs further education  HOME EXERCISE PROGRAM: ***  ASSESSMENT:  CLINICAL IMPRESSION: Patient is a *** y.o. *** who was seen today for physical therapy evaluation and treatment for ***.   OBJECTIVE IMPAIRMENTS: {opptimpairments:25111}.   ACTIVITY LIMITATIONS: {activitylimitations:27494}  PERSONAL FACTORS: {Personal factors:25162} are also affecting patient's functional outcome.   REHAB POTENTIAL: Good  CLINICAL DECISION MAKING: Stable/uncomplicated  EVALUATION COMPLEXITY: Low   GOALS: Goals reviewed with patient? No  SHORT TERM GOALS: Target date: ***  Patient to  demonstrate independence in HEP  Baseline: Goal status: INITIAL  2.  *** Baseline:  Goal status: INITIAL  3.  *** Baseline:  Goal status: INITIAL  4.  *** Baseline:  Goal status: INITIAL  5.  *** Baseline:  Goal status: INITIAL  6.  *** Baseline:  Goal status: INITIAL  LONG TERM GOALS: Target date: ***  Patient will score at least ***% on FOTO to signify clinically meaningful improvement in functional abilities.   Baseline:  Goal status: INITIAL  2.  Patient will acknowledge ***/10 pain at least once during episode of care   Baseline:  Goal status: INITIAL  3.  Patient will increase 30s chair stand reps from *** to *** with/without arms to demonstrate and improved functional ability with less pain/difficulty as well as reduce fall risk.  Baseline:  Goal status: INITIAL  4.  *** Baseline:  Goal status: INITIAL  5.  *** Baseline:  Goal status: INITIAL  6.  *** Baseline:  Goal  status: INITIAL  PLAN:  PT FREQUENCY: 1-2x/week  PT DURATION: 6 weeks  PLANNED INTERVENTIONS: 97110-Therapeutic exercises, 97530- Therapeutic activity, [id number]- Neuromuscular re-education, 97535- Self Care, [id number]- Manual therapy, Patient/Family education, Joint mobilization, and Spinal mobilization.  PLAN FOR NEXT SESSION: HEP review and update, manual techniques as appropriate, aerobic tasks, ROM and flexibility activities, strengthening and PREs, TPDN, gait and balance training as needed     Reyes CHRISTELLA Kohut, PT 05/19/2024, 9:37 AM

## 2024-05-20 ENCOUNTER — Ambulatory Visit

## 2024-05-20 DIAGNOSIS — Z419 Encounter for procedure for purposes other than remedying health state, unspecified: Secondary | ICD-10-CM | POA: Diagnosis not present

## 2024-05-26 ENCOUNTER — Encounter: Admitting: Family Medicine

## 2024-05-27 ENCOUNTER — Encounter: Payer: Self-pay | Admitting: Family Medicine

## 2024-05-29 NOTE — Therapy (Signed)
 OUTPATIENT PHYSICAL THERAPY THORACOLUMBAR EVALUATION   Patient Name: Megan Cherry MRN: [id number] DOB:1996-06-20, 28 y.o., female Today's Date: 05/31/2024  END OF SESSION:  PT End of Session - 05/30/24 0935     Visit Number 1    Number of Visits 7    Date for PT Re-Evaluation 07/12/24    Authorization Type [place] [employer] [facility]    Authorization - Visit Number 1    PT Start Time 0930    PT Stop Time 1015    PT Time Calculation (min) 45 min    Activity Tolerance Patient tolerated treatment well    Behavior During Therapy WFL for tasks assessed/performed          History reviewed. No pertinent past medical history. Past Surgical History:  Procedure Laterality Date   COLONOSCOPY  2024   There are no active problems to display for this patient.   PCP: Joesph Annabella HERO, FNP   REFERRING PROVIDER: Persons, Ronal Dragon, [place]  REFERRING DIAG: M54.6 (ICD-10-CM) - Pain in thoracic spine  Rationale for Evaluation and Treatment: Rehabilitation  THERAPY DIAG:  Other low back pain  Muscle weakness (generalized)  ONSET DATE: chronic  SUBJECTIVE:                                                                                                                                                                                           SUBJECTIVE STATEMENT: Pt reports a chronic history low/mid back pain with spot of her back when pressed takes her breath away. She also notes having intermittent burning sensation of her lower neck with certain movements. She notes the pain does not prevent her from doing her normal daily actities, but when she is more active the pain increases.  PERTINENT HISTORY:  Note from Ronal Dragon Persons, PA With regards to her other issues she has been having focal thoracic spine pain for about a year.  She denies any injury.  She does say she has a family history of back issues and as she is engaged in preparing to get married she does not want this to affect her  as she is married and anticipates having a family.  She said it does not give her much radicular symptoms but is focal focally tender in 1 area.  Exam today she has 1 area of focal tenderness no radicular findings been going on over a year without any type of particular injury.  I have told her we could get an MRI she could follow-up with one of our spine  PAIN:  Are you having pain? Yes: NPRS scale: current 3/10; Pain range the week prior to  PT: 3-8/10 Pain location: low er neck and mid back Pain description: burning, ache, sharp Aggravating factors: Lifting, bending, up/down step, AB workout in pliates Relieving factors: heating pad  PRECAUTIONS: None  RED FLAGS: None   WEIGHT BEARING RESTRICTIONS: No  FALLS:  Has patient fallen in last 6 months? No  OCCUPATION: bartender, real estate agent  PLOF: Independent  PATIENT GOALS: Less pain and tolerate activity better  NEXT MD VISIT: TBD  OBJECTIVE:  Note: Objective measures were completed at Evaluation unless otherwise noted.  DIAGNOSTIC FINDINGS:  FINDINGS: Pedicles are within normal limits. Vertebral body height is well maintained. No paraspinal mass is seen. No soft tissue changes are noted. Visualize ribcage is within normal limits.   IMPRESSION: No acute abnormality noted.     Electronically Signed   By: Oneil Devonshire M.D.   On: 01/23/2024 01:28  PATIENT SURVEYS:  Modified Oswestry: 5/50=10%  Interpretation of scores: Score Category Description  0-20% Minimal Disability The patient can cope with most living activities. Usually no treatment is indicated apart from advice on lifting, sitting and exercise  21-40% Moderate Disability The patient experiences more pain and difficulty with sitting, lifting and standing. Travel and social life are more difficult and they may be disabled from work. Personal care, sexual activity and sleeping are not grossly affected, and the patient can usually be managed by conservative  means  41-60% Severe Disability Pain remains the main problem in this group, but activities of daily living are affected. These patients require a detailed investigation  61-80% Crippled Back pain impinges on all aspects of the patient's life. Positive intervention is required  81-100% Bed-bound  These patients are either bed-bound or exaggerating their symptoms  Bluford FORBES Zoe DELENA Karon DELENA, et al. Surgery versus conservative management of stable thoracolumbar fracture: the PRESTO feasibility RCT. [place]): [employer]; 2021 Nov. [facility] Technology Assessment, No. 25.62.) Appendix 3, Oswestry Disability Index category descriptors. Available from: FindJewel[employer].cz  Minimally Clinically Important Difference (MCID) = 12.8%   MUSCLE LENGTH: Hamstrings: Right WNLs deg; Left WNLs deg Debby test: Right WNLs deg; Left WNLs deg  POSTURE: rounded shoulders and forward head decreased lordosis  PALPATION: Tender to Midline PA mods T10-L1 spinous processes with appropriate accessory movements  LUMBAR ROM:   AROM eval  Flexion 25% limited mid/low back pain with return  Extension Full; stiff and painfull mid/low back  Right lateral flexion Full  Left lateral flexion Full  Right rotation Full  Left rotation Full   (Blank rows = not tested)  LOWER EXTREMITY ROM:     WFLs Active  Right eval Left eval  Hip flexion    Hip extension    Hip abduction    Hip adduction    Hip internal rotation    Hip external rotation    Knee flexion    Knee extension    Ankle dorsiflexion    Ankle plantarflexion    Ankle inversion    Ankle eversion     (Blank rows = not tested)  LOWER EXTREMITY MMT:    WNLs MMT Right eval Left eval  Hip flexion    Hip extension    Hip abduction    Hip adduction    Hip internal rotation    Hip external rotation    Knee flexion    Knee extension    Ankle dorsiflexion    Ankle plantarflexion    Ankle inversion     Ankle eversion     (Blank rows = not  tested)  LUMBAR SPECIAL TESTS:  Straight leg raise test: Negative and Slump test: Negative  FUNCTIONAL TESTS:  5xSTS:TBA Low plank from toes 10 Weak core Bridge: 60 WNLs  GAIT: Distance walked: 52ftx2 Assistive device utilized: None Level of assistance: Complete Independence Comments: WNLs  TREATMENT:                                                                                                                            [facility] Adult PT Treatment:                                                DATE: 05/30/24 Therapeutic Exercise: Developed, instructed in, and pt completed therex as noted in HEP   PATIENT EDUCATION:  Education details: Patient to demonstrate independence in HEP  Person educated: Patient Education method: Chief Technology [employer] Education comprehension: verbalized understanding and needs further education  HOME EXERCISE PROGRAM: Access Code: [email or web address] URL: [email or web address] Date: 05/30/2024 Prepared by: Dasie Daft  Exercises - Cat Cow  - 1 x daily - 7 x weekly - 1 sets - 10 reps - 3-5 hold - Beginner Front Arm Support  - 1 x daily - 7 x weekly - 1 sets - 10 reps - 3 hold - Bird Dog  - 1 x daily - 7 x weekly - 1 sets - 10 reps - 3 hold - Supine Posterior Pelvic Tilt  - 1 x daily - 7 x weekly - 1 sets - 10 reps - 3 hold - Shoulder Extension with Resistance  - 1 x daily - 7 x weekly - 1 sets - 10 reps - 3 hold  ASSESSMENT:  CLINICAL IMPRESSION: Patient is a 28 y.o. female who was seen today for physical therapy evaluation and treatment for M54.6 (ICD-10-CM) - Pain in thoracic spine. Pt presents with with low/mid back pain which does not significantly limit her usual daily functional activities as found per her subjective report and with a 10% limitation score on the Mod Oswestry. Pt is tender to PA mobs T10-L1, but has appropriate accessory movement. Pt's core was found to be weak. Pt will benefit from  skilled PT 1w6 to address impairments to optimize back function with less pain.   OBJECTIVE IMPAIRMENTS: decreased strength and pain.   ACTIVITY LIMITATIONS: lifting and squatting  PERSONAL FACTORS: Time since onset of injury/illness/exacerbation are also affecting patient's functional outcome.   REHAB POTENTIAL: Good  CLINICAL DECISION MAKING: Stable/uncomplicated  EVALUATION COMPLEXITY: Low   GOALS: STG  = LTGS  LONG TERM GOALS: Target date: 07/12/24  1.  Patient will report a 50% improvement in her low/mid back pain with daily activities for improved function and QOL Baseline:  Goal status: INITIAL  2. Improve 5xSTS by MCID of 5 as indication of improved functional mobility  Baseline: TBA Goal status: INITIAL  4.  Pt wil be able to sustain a low plank from her toes for 30 as indication of improved core strength  Baseline: 10 Goal status: INITIAL  5.  Pt will demonstrate proper body mechanics for home activities to minimize back strain and pain Baseline:  Goal status: INITIAL  6.  Pt will be able to complete a hinged hip lift c 20# with no more more than a 2 point pain increase for improved ability to complete and tolerate higher demand functional activities at home Baseline:  Goal status: INITIAL  PLAN:  PT FREQUENCY: 1-2x/week  PT DURATION: 6 weeks  PLANNED INTERVENTIONS: 97110-Therapeutic exercises, 97530- Therapeutic activity, [id number]- Neuromuscular re-education, 97535- Self Care, 02859- Manual therapy, Patient/Family education, Joint mobilization, and Spinal mobilization.  PLAN FOR NEXT SESSION: HEP review and update, manual techniques as appropriate, aerobic tasks, ROM and flexibility activities, strengthening and PREs, TPDN, gait and balance training as needed   [employer] MS, PT 05/31/24 3:58 PM  [employer] Authorization   Choose one: Rehabilitative  Standardized Assessment or Functional Outcome Tool: See Pain Assessment and Oswestry  Score or  Percent Disability: 10% Mod Oswestry  Body Parts Treated (Select each separately):  Lumbopelvic. Overall deficits/functional limitations for body part selected: moderate Cervicothoracic. Overall deficits/functional limitations for body part selected: moderate  If treatment provided at initial evaluation, no treatment charged due to lack of authorization.

## 2024-05-30 ENCOUNTER — Ambulatory Visit: Attending: Physician Assistant

## 2024-05-30 ENCOUNTER — Other Ambulatory Visit: Payer: Self-pay

## 2024-05-30 DIAGNOSIS — M6281 Muscle weakness (generalized): Secondary | ICD-10-CM | POA: Insufficient documentation

## 2024-05-30 DIAGNOSIS — M5459 Other low back pain: Secondary | ICD-10-CM | POA: Diagnosis not present

## 2024-06-12 ENCOUNTER — Ambulatory Visit: Admitting: Physical Therapy

## 2024-06-18 ENCOUNTER — Ambulatory Visit: Admitting: Physical Therapy

## 2024-06-20 DIAGNOSIS — Z419 Encounter for procedure for purposes other than remedying health state, unspecified: Secondary | ICD-10-CM | POA: Diagnosis not present

## 2024-06-23 NOTE — Therapy (Incomplete)
 OUTPATIENT PHYSICAL THERAPY THORACOLUMBAR TREATMENT   Patient Name: Megan Cherry MRN: [id number] DOB:04/07/1996, 28 y.o., female Today's Date: 06/23/2024  END OF SESSION:    No past medical history on file. Past Surgical History:  Procedure Laterality Date   COLONOSCOPY  2024   There are no active problems to display for this patient.   PCP: Joesph Annabella HERO, FNP   REFERRING PROVIDER: Persons, Ronal Dragon, [place]  REFERRING DIAG: M54.6 (ICD-10-CM) - Pain in thoracic spine  Rationale for Evaluation and Treatment: Rehabilitation  THERAPY DIAG:  No diagnosis found.  ONSET DATE: chronic  SUBJECTIVE:                                                                                                                                                                                           SUBJECTIVE STATEMENT:   EVAL: Pt reports a chronic history low/mid back pain with spot of her back when pressed takes her breath away. She also notes having intermittent burning sensation of her lower neck with certain movements. She notes the pain does not prevent her from doing her normal daily actities, but when she is more active the pain increases.  PERTINENT HISTORY:  Note from Ronal Dragon Persons, PA With regards to her other issues she has been having focal thoracic spine pain for about a year.  She denies any injury.  She does say she has a family history of back issues and as she is engaged in preparing to get married she does not want this to affect her as she is married and anticipates having a family.  She said it does not give her much radicular symptoms but is focal focally tender in 1 area.  Exam today she has 1 area of focal tenderness no radicular findings been going on over a year without any type of particular injury.  I have told her we could get an MRI she could follow-up with one of our spine  PAIN:  Are you having pain? Yes: NPRS scale: current 3/10; Pain range the week prior to  PT: 3-8/10 Pain location: low er neck and mid back Pain description: burning, ache, sharp Aggravating factors: Lifting, bending, up/down step, AB workout in pliates Relieving factors: heating pad  PRECAUTIONS: None  RED FLAGS: None   WEIGHT BEARING RESTRICTIONS: No  FALLS:  Has patient fallen in last 6 months? No  OCCUPATION: bartender, real estate agent  PLOF: Independent  PATIENT GOALS: Less pain and tolerate activity better  NEXT MD VISIT: TBD  OBJECTIVE:  Note: Objective measures were completed at Evaluation unless otherwise noted.  DIAGNOSTIC FINDINGS:  FINDINGS: Pedicles are within  normal limits. Vertebral body height is well maintained. No paraspinal mass is seen. No soft tissue changes are noted. Visualize ribcage is within normal limits.   IMPRESSION: No acute abnormality noted.     Electronically Signed   By: Oneil Devonshire M.D.   On: 01/23/2024 01:28  PATIENT SURVEYS:  Modified Oswestry: 5/50=10%  Interpretation of scores: Score Category Description  0-20% Minimal Disability The patient can cope with most living activities. Usually no treatment is indicated apart from advice on lifting, sitting and exercise  21-40% Moderate Disability The patient experiences more pain and difficulty with sitting, lifting and standing. Travel and social life are more difficult and they may be disabled from work. Personal care, sexual activity and sleeping are not grossly affected, and the patient can usually be managed by conservative means  41-60% Severe Disability Pain remains the main problem in this group, but activities of daily living are affected. These patients require a detailed investigation  61-80% Crippled Back pain impinges on all aspects of the patient's life. Positive intervention is required  81-100% Bed-bound  These patients are either bed-bound or exaggerating their symptoms  Bluford FORBES Zoe DELENA Karon DELENA, et al. Surgery versus conservative management of  stable thoracolumbar fracture: the PRESTO feasibility RCT. [place] ([place]): [employer]; 2021 Nov. [facility] Technology Assessment, No. 25.62.) Appendix 3, Oswestry Disability Index category descriptors. Available from: FindJewelers.cz  Minimally Clinically Important Difference (MCID) = 12.8%   MUSCLE LENGTH: Hamstrings: Right WNLs deg; Left WNLs deg Debby test: Right WNLs deg; Left WNLs deg  POSTURE: rounded shoulders and forward head decreased lordosis  PALPATION: Tender to Midline PA mods T10-L1 spinous processes with appropriate accessory movements  LUMBAR ROM:   AROM eval  Flexion 25% limited mid/low back pain with return  Extension Full; stiff and painfull mid/low back  Right lateral flexion Full  Left lateral flexion Full  Right rotation Full  Left rotation Full   (Blank rows = not tested)  LOWER EXTREMITY ROM:     WFLs Active  Right eval Left eval  Hip flexion    Hip extension    Hip abduction    Hip adduction    Hip internal rotation    Hip external rotation    Knee flexion    Knee extension    Ankle dorsiflexion    Ankle plantarflexion    Ankle inversion    Ankle eversion     (Blank rows = not tested)  LOWER EXTREMITY MMT:    WNLs MMT Right eval Left eval  Hip flexion    Hip extension    Hip abduction    Hip adduction    Hip internal rotation    Hip external rotation    Knee flexion    Knee extension    Ankle dorsiflexion    Ankle plantarflexion    Ankle inversion    Ankle eversion     (Blank rows = not tested)  LUMBAR SPECIAL TESTS:  Straight leg raise test: Negative and Slump test: Negative  FUNCTIONAL TESTS:  5xSTS:TBA Low plank from toes 10 Weak core Bridge: 60 WNLs  GAIT: Distance walked: 55ftx2 Assistive device utilized: None Level of assistance: Complete Independence Comments: WNLs  TREATMENT:    [facility] Adult PT Treatment:  DATE:  06/25/24 Therapeutic Exercise: - Cat Cow  - 1 x daily - 7 x weekly - 1 sets - 10 reps - 3-5 hold - Beginner Front Arm Support  - 1 x daily - 7 x weekly - 1 sets - 10 reps - 3 hold - Bird Dog  - 1 x daily - 7 x weekly - 1 sets - 10 reps - 3 hold - Supine Posterior Pelvic Tilt  - 1 x daily - 7 x weekly - 1 sets - 10 reps - 3 hold - Shoulder Extension with Resistance  - 1 x daily - 7 x weekly - 1 sets - 10 reps - 3 hold Manual Therapy: *** Neuromuscular re-ed: *** Therapeutic Activity: *** Modalities: *** Self Care: ***                                                                                                                          [facility] Adult PT Treatment:                                                DATE: 05/30/24 Therapeutic Exercise: Developed, instructed in, and pt completed therex as noted in HEP   PATIENT EDUCATION:  Education details: Patient to demonstrate independence in HEP  Person educated: Patient Education method: Chief Technology Officer Education comprehension: verbalized understanding and needs further education  HOME EXERCISE PROGRAM: Access Code: [email or web address] URL: [email or web address] Date: 05/30/2024 Prepared by: Dasie Daft  Exercises - Cat Cow  - 1 x daily - 7 x weekly - 1 sets - 10 reps - 3-5 hold - Beginner Front Arm Support  - 1 x daily - 7 x weekly - 1 sets - 10 reps - 3 hold - Bird Dog  - 1 x daily - 7 x weekly - 1 sets - 10 reps - 3 hold - Supine Posterior Pelvic Tilt  - 1 x daily - 7 x weekly - 1 sets - 10 reps - 3 hold - Shoulder Extension with Resistance  - 1 x daily - 7 x weekly - 1 sets - 10 reps - 3 hold  ASSESSMENT:  CLINICAL IMPRESSION:   EVAL: Patient is a 28 y.o. female who was seen today for physical therapy evaluation and treatment for M54.6 (ICD-10-CM) - Pain in thoracic spine. Pt presents with with low/mid back pain which does not significantly limit her usual daily functional activities as found per her subjective  report and with a 10% limitation score on the Mod Oswestry. Pt is tender to PA mobs T10-L1, but has appropriate accessory movement. Pt's core was found to be weak. Pt will benefit from skilled PT 1w6 to address impairments to optimize back function with less pain.   OBJECTIVE IMPAIRMENTS: decreased strength and pain.   ACTIVITY LIMITATIONS: lifting and squatting  PERSONAL FACTORS: Time since onset of injury/illness/exacerbation  are also affecting patient's functional outcome.   REHAB POTENTIAL: Good  CLINICAL DECISION MAKING: Stable/uncomplicated  EVALUATION COMPLEXITY: Low   GOALS: STG  = LTGS  LONG TERM GOALS: Target date: 07/12/24  1.  Patient will report a 50% improvement in her low/mid back pain with daily activities for improved function and QOL Baseline:  Goal status: INITIAL  2. Improve 5xSTS by MCID of 5 as indication of improved functional mobility  Baseline: TBA Goal status: INITIAL  4.  Pt wil be able to sustain a low plank from her toes for 30 as indication of improved core strength  Baseline: 10 Goal status: INITIAL  5.  Pt will demonstrate proper body mechanics for home activities to minimize back strain and pain Baseline:  Goal status: INITIAL  6.  Pt will be able to complete a hinged hip lift c 20# with no more more than a 2 point pain increase for improved ability to complete and tolerate higher demand functional activities at home Baseline:  Goal status: INITIAL  PLAN:  PT FREQUENCY: 1-2x/week  PT DURATION: 6 weeks  PLANNED INTERVENTIONS: 97110-Therapeutic exercises, 97530- Therapeutic activity, [id number]- Neuromuscular re-education, 97535- Self Care, 02859- Manual therapy, Patient/Family education, Joint mobilization, and Spinal mobilization.  PLAN FOR NEXT SESSION: HEP review and update, manual techniques as appropriate, aerobic tasks, ROM and flexibility activities, strengthening and PREs, TPDN, gait and balance training as needed   [employer]  MS, PT 06/23/24 4:59 PM  [employer] Authorization   Choose one: Rehabilitative  Standardized Assessment or Functional Outcome Tool: See Pain Assessment and Oswestry  Score or Percent Disability: 10% Mod Oswestry  Body Parts Treated (Select each separately):  Lumbopelvic. Overall deficits/functional limitations for body part selected: moderate Cervicothoracic. Overall deficits/functional limitations for body part selected: moderate  If treatment provided at initial evaluation, no treatment charged due to lack of authorization.

## 2024-06-25 ENCOUNTER — Ambulatory Visit: Attending: Physician Assistant

## 2024-06-25 ENCOUNTER — Telehealth: Payer: Self-pay

## 2024-06-25 NOTE — Telephone Encounter (Signed)
 Left VM re: no show appt. Pt advised she has not additional appts scheduled, but she may schedule 1 appt at a time if she wishes. Pt has had 2 cancellations and today's no show appt.

## 2024-08-11 ENCOUNTER — Encounter: Payer: Self-pay | Admitting: Radiology
